# Patient Record
Sex: Female | Born: 2000 | Race: Black or African American | Hispanic: No | Marital: Single | Smoking: Never smoker
Health system: Southern US, Community
[De-identification: ages and names within clinical notes are randomized; demographics above are authoritative.]

---
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
## 2018-10-14 DIAGNOSIS — Z0279 Encounter for issue of other medical certificate: Secondary | ICD-10-CM

## 2020-12-22 ENCOUNTER — Emergency Department (HOSPITAL_COMMUNITY): Payer: 59

## 2020-12-22 ENCOUNTER — Encounter (HOSPITAL_COMMUNITY): Payer: Self-pay

## 2020-12-22 ENCOUNTER — Emergency Department (HOSPITAL_COMMUNITY)
Admission: EM | Admit: 2020-12-22 | Discharge: 2020-12-22 | Disposition: A | Discharge status: Home / Self Care | Payer: 59 | Attending: Emergency Medicine | Admitting: Emergency Medicine

## 2020-12-22 ENCOUNTER — Other Ambulatory Visit: Payer: Self-pay

## 2020-12-22 DIAGNOSIS — R509 Fever, unspecified: Secondary | ICD-10-CM | POA: Diagnosis present

## 2020-12-22 DIAGNOSIS — J101 Influenza due to other identified influenza virus with other respiratory manifestations: Secondary | ICD-10-CM | POA: Diagnosis not present

## 2020-12-22 DIAGNOSIS — Z20822 Contact with and (suspected) exposure to covid-19: Secondary | ICD-10-CM | POA: Diagnosis not present

## 2020-12-22 LAB — RESP PANEL BY RT-PCR (FLU A&B, COVID) ARPGX2
Influenza A by PCR: POSITIVE — AB
Influenza B by PCR: NEGATIVE
SARS Coronavirus 2 by RT PCR: NEGATIVE

## 2020-12-22 MED ORDER — OSELTAMIVIR PHOSPHATE 75 MG PO CAPS: 75.0000 mg | ORAL_CAPSULE | Freq: Two times a day (BID) | ORAL | 0 refills | Status: AC

## 2020-12-22 MED ORDER — ACETAMINOPHEN 325 MG PO TABS
650.0000 mg | ORAL_TABLET | Freq: Once | ORAL | Status: AC | PRN
  Administered 2020-12-22: 650 mg via ORAL
  Filled 2020-12-22: qty 2

## 2020-12-22 NOTE — ED Notes (Signed)
An After Visit Summary was printed and given to the patient. Discharge instructions given and no further questions at this time.  

## 2020-12-22 NOTE — ED Provider Notes (Signed)
[facility]-EMERGENCY DEPT Provider Note   CSN: [id number] Arrival date & time: 12/22/20  1012     History Chief Complaint  Patient presents with   Shortness of Breath   Fever   Chills    GENEIEVE DUELL is a 20 y.o. female with no significant past medical history presents to the ED due to shortness of breath, fever, cough, and chills x2 days.  Patient states her coworker is sick with similar symptoms however, tested negative for COVID.  Denies associated chest pain.  No history of asthma.  No history of blood clots.  Denies nausea, vomiting, diarrhea.  She has tried over-the-counter medications with no relief.  No aggravating or alleviating factors.  History obtained from patient and past medical records. No interpreter used during encounter.       Past Medical History:  Diagnosis Date   Constipation    chronic    Patient Active Problem List   Diagnosis Date Noted   Acne vulgaris 05/31/2017   Encounter for routine child health examination without abnormal findings 07/22/2016   BMI (body mass index), pediatric, less than 5th percentile for age 78/25/2018   Viral upper respiratory tract infection 03/18/2016   Well adolescent visit 02/07/2014   Failed vision screen 02/07/2014   Spinal curvature 02/07/2014   Allergic rhinitis 05/22/2012   Knee pain, right anterior 11/19/2011   Eczema 02/07/2011    Class: Chronic    History reviewed. No pertinent surgical history.   OB History   No obstetric history on file.     Family History  Problem Relation Age of Onset   Miscarriages / [place] Mother    Varicose Veins Mother    Scoliosis Mother    Diabetes Maternal Grandfather    Hypertension Maternal Grandfather    Cancer Paternal Grandmother        lukemia   Heart disease Maternal Uncle    Alcohol abuse Neg Hx    Arthritis Neg Hx    Asthma Neg Hx    Birth defects Neg Hx    COPD Neg Hx    Depression Neg Hx    Drug abuse Neg Hx    Early  death Neg Hx    Hearing loss Neg Hx    Hyperlipidemia Neg Hx    Kidney disease Neg Hx    Learning disabilities Neg Hx    Mental illness Neg Hx    Mental retardation Neg Hx    Stroke Neg Hx    Vision loss Neg Hx     Social History   Tobacco Use   Smoking status: Never   Smokeless tobacco: Never  Vaping Use   Vaping Use: Never used  Substance Use Topics   Alcohol use: No   Drug use: No    Home Medications Prior to Admission medications   Medication Sig Start Date End Date Taking? Authorizing Provider  oseltamivir (TAMIFLU) 75 MG capsule Take 1 capsule (75 mg total) by mouth every 12 (twelve) hours. 12/22/20  Yes Clarene Curran C, PA-C  albuterol (PROVENTIL HFA;VENTOLIN HFA) 108 (90 Base) MCG/ACT inhaler Inhale 1-2 puffs into the lungs every 6 (six) hours as needed for wheezing or shortness of breath. 03/09/15 04/06/15  Estelle June, NP  cetirizine (ZYRTEC) 10 MG tablet Take 1 tablet (10 mg total) by mouth daily. 02/02/15   Gretchen Short, NP  clindamycin-benzoyl peroxide (BENZACLIN) gel Apply topically 2 (two) times daily. 05/31/17   Estelle June, NP  fluticasone (FLONASE) 50 MCG/ACT  nasal spray Place 1 spray into both nostrils 2 (two) times daily. 02/02/15 02/16/15  Gretchen Short, NP  mometasone (ELOCON) 0.1 % cream apply to affected area once daily 03/21/12   Georgiann Hahn, MD  ondansetron (ZOFRAN) 4 MG tablet Take 1 tablet (4 mg total) by mouth every 8 (eight) hours as needed for nausea or vomiting. 01/17/16   Charm Rings, MD    Allergies    Patient has no known allergies.  Review of Systems   Review of Systems  Constitutional:  Positive for chills and fever.  Respiratory:  Positive for cough and shortness of breath.   Cardiovascular:  Negative for chest pain and leg swelling.  Gastrointestinal:  Negative for abdominal pain, diarrhea, nausea and vomiting.  All other systems reviewed and are negative.  Physical Exam Updated Vital Signs BP 115/87   Pulse 96   Temp  (!) 101.6 F (38.7 C) (Oral)   Resp 18   Ht 5\' 8"  (1.727 m)   Wt 49.9 kg   LMP 12/14/2020   SpO2 99%   BMI 16.73 kg/m   Physical Exam Vitals and nursing note reviewed.  Constitutional:      General: She is not in acute distress.    Appearance: She is not ill-appearing.  HENT:     Head: Normocephalic.  Eyes:     Pupils: Pupils are equal, round, and reactive to light.  Cardiovascular:     Rate and Rhythm: Normal rate and regular rhythm.     Pulses: Normal pulses.     Heart sounds: Normal heart sounds. No murmur heard.   No friction rub. No gallop.  Pulmonary:     Effort: Pulmonary effort is normal.     Breath sounds: Normal breath sounds.     Comments: Respirations equal and unlabored, patient able to speak in full sentences, lungs clear to auscultation bilaterally Abdominal:     General: Abdomen is flat. There is no distension.     Palpations: Abdomen is soft.     Tenderness: There is no abdominal tenderness. There is no guarding or rebound.  Musculoskeletal:        General: Normal range of motion.     Cervical back: Neck supple.  Skin:    General: Skin is warm and dry.  Neurological:     General: No focal deficit present.     Mental Status: She is alert.  Psychiatric:        Mood and Affect: Mood normal.        Behavior: Behavior normal.    ED Results / Procedures / Treatments   Labs (all labs ordered are listed, but only abnormal results are displayed) Labs Reviewed  RESP PANEL BY RT-PCR (FLU A&B, COVID) ARPGX2 - Abnormal; Notable for the following components:      Result Value   Influenza A by PCR POSITIVE (*)    All other components within normal limits    EKG None  Radiology DG Chest 1 View  Result Date: 12/22/2020 CLINICAL DATA:  Patient c/o SOB, fever, a non productive cough, and chills x 2 days. Nonsmoker EXAM: CHEST  1 VIEW COMPARISON:  02/02/2015 FINDINGS: The heart size and mediastinal contours are within normal limits. Both lungs are clear. No  pleural effusion or pneumothorax. Stable S-shaped thoracolumbar scoliosis. IMPRESSION: No active disease. Electronically Signed   By: 04/02/2015 M.D.   On: 12/22/2020 12:28    Procedures Procedures   Medications Ordered in ED Medications  acetaminophen (TYLENOL) tablet  650 mg (650 mg Oral Given 12/22/20 1033)    ED Course  I have reviewed the triage vital signs and the nursing notes.  Pertinent labs & imaging results that were available during my care of the patient were reviewed by me and considered in my medical decision making (see chart for details).  Clinical Course as of 12/23/20 1520  Fri Dec 22, 2020  1201 Influenza A By PCR(!): POSITIVE [CA]    Clinical Course User Index [CA] Mannie Stabile, PA-C   MDM Rules/Calculators/A&P                          20 year old female presents to the ED due to shortness of breath, fever, cough, and chills x2 days.  Coworker sick with similar symptoms.  Upon arrival, patient febrile and tachycardic.  Patient nontoxic-appearing.  Benign physical exam.  Lungs clear to auscultation bilaterally.  Chest x-ray ordered.  COVID/influenza test ordered.  Tylenol given.  Influenza A positive. Patient discharged with Tamiflu.  No signs of respiratory distress. CXR negative. HR improved after Tylenol. No evidence of DVT on exam. Low suspicion for PE/DVT. Advised patient to follow-up with PCP symptoms unimproved over the next week. Strict ED precautions discussed with patient. Patient states understanding and agrees to plan. Patient discharged home in no acute distress and stable vitals.  Final Clinical Impression(s) / ED Diagnoses Final diagnoses:  Influenza A    Rx / DC Orders ED Discharge Orders          Ordered    oseltamivir (TAMIFLU) 75 MG capsule  Every 12 hours        12/22/20 1247             Mannie Stabile, PA-C 12/23/20 1520    [address] DO 12/24/20 [phone]

## 2020-12-22 NOTE — Discharge Instructions (Signed)
It was a pleasure taking care of you today.  As discussed, your flu test was positive.  I am sending you home with Tamiflu.  Take as prescribed.  Follow-up with PCP if symptoms do not improve over the next few days.  Return to the ER for any worsening symptoms.

## 2020-12-22 NOTE — ED Triage Notes (Addendum)
Patient c/o SOB, fever, a non productive cough, and chills x 2 days.

## 2021-11-27 DIAGNOSIS — L700 Acne vulgaris: Secondary | ICD-10-CM | POA: Diagnosis not present

## 2022-04-20 IMAGING — DX DG CHEST 1V
1 series · 1 of 1 positions shown · non-contrast
Comparison: 02/02/2015

CLINICAL DATA: Patient c/o SOB, fever, a non productive cough, and
chills x 2 days. Nonsmoker

EXAM:
CHEST  1 VIEW

[chest ap]
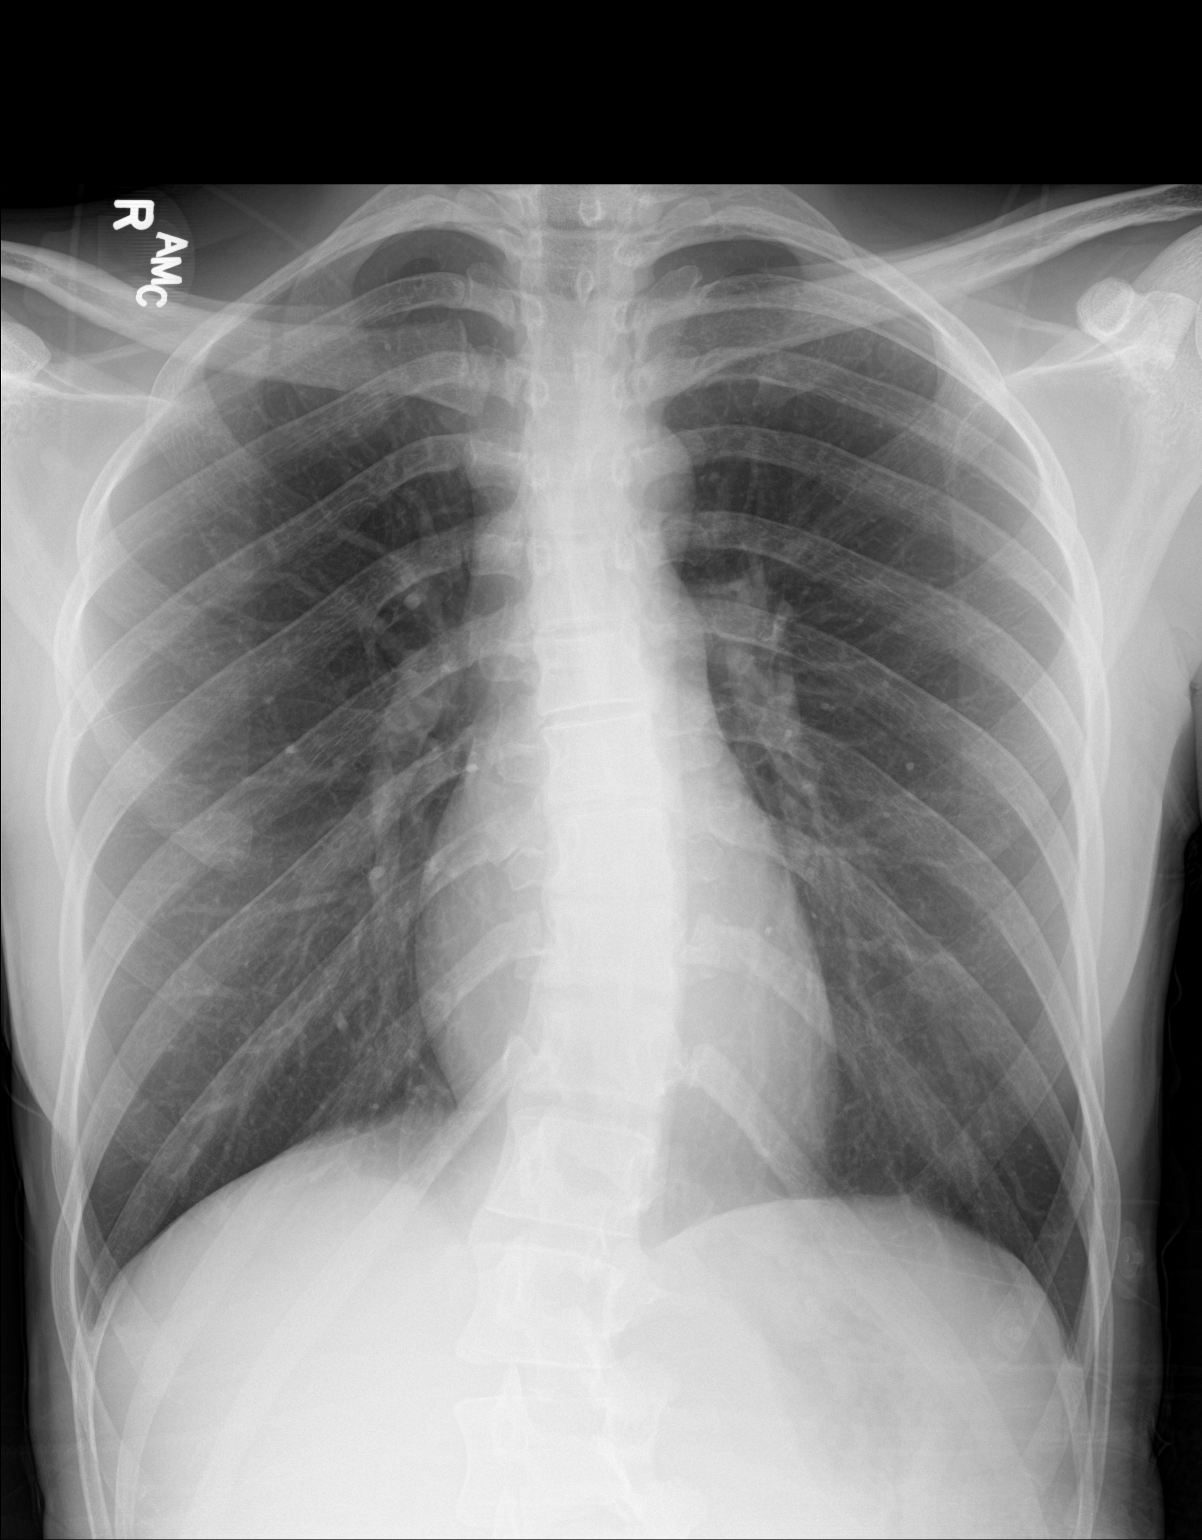

[1 of 1 positions shown; findings below may reference images not displayed]

FINDINGS: The heart size and mediastinal contours are within normal limits.
Both lungs are clear. No pleural effusion or pneumothorax.

Stable S-shaped thoracolumbar scoliosis.
IMPRESSION: No active disease.
